# Patient Record
Sex: Male | Born: 1985 | Race: Black or African American | Marital: Single
Health system: Midwestern US, Community
[De-identification: ages and names within clinical notes are randomized; demographics above are authoritative.]

---

## 2013-01-04 LAB — METABOLIC PANEL, COMPREHENSIVE
A-G Ratio: 1.2 (ref 1.2–3.5)
ALT (SGPT): 30 U/L (ref 12–65)
AST (SGOT): 18 U/L (ref 15–37)
Albumin: 4.1 g/dL (ref 3.5–5.0)
Alk. phosphatase: 71 U/L (ref 50–136)
Anion gap: 3 mmol/L — ABNORMAL LOW (ref 7–16)
BUN: 9 MG/DL (ref 6–23)
Bilirubin, total: 0.2 MG/DL (ref 0.2–1.1)
CO2: 33 mmol/L — ABNORMAL HIGH (ref 21–32)
Calcium: 9.2 MG/DL (ref 8.3–10.4)
Chloride: 101 mmol/L (ref 98–107)
Creatinine: 1.4 MG/DL (ref 0.8–1.5)
GFR est AA: >60 mL/min/{1.73_m2} (ref >60)
GFR est non-AA: >60 mL/min/{1.73_m2} (ref >60)
Globulin: 3.4 g/dL (ref 2.3–3.5)
Glucose: 90 mg/dL (ref 65–100)
Potassium: 3.3 mmol/L — ABNORMAL LOW (ref 3.5–5.1)
Protein, total: 7.5 g/dL (ref 6.3–8.2)
Sodium: 137 mmol/L (ref 136–145)

## 2013-01-04 LAB — EKG, 12 LEAD, INITIAL
Atrial Rate: 67 {beats}/min
Calculated P Axis: 55 degrees
Calculated R Axis: 79 degrees
Calculated T Axis: 41 degrees
Diagnosis: NORMAL
P-R Interval: 172 ms
Q-T Interval: 408 ms
QRS Duration: 104 ms
QTC Calculation (Bezet): 431 ms
Ventricular Rate: 67 {beats}/min

## 2013-01-04 LAB — CBC WITH AUTOMATED DIFF
ABS. BASOPHILS: 0 10*3/uL (ref 0.0–0.2)
ABS. EOSINOPHILS: 0.2 10*3/uL (ref 0.0–0.8)
ABS. IMM. GRANS.: 0 10*3/uL (ref 0.0–0.5)
ABS. LYMPHOCYTES: 4.5 10*3/uL (ref 0.5–4.6)
ABS. MONOCYTES: 0.7 10*3/uL (ref 0.1–1.3)
ABS. NEUTROPHILS: 4.8 10*3/uL (ref 1.7–8.2)
BASOPHILS: 0 % (ref 0.0–2.0)
EOSINOPHILS: 2 % (ref 0.5–7.8)
HCT: 42.8 % (ref 41.1–50.3)
HGB: 15.1 g/dL (ref 13.6–17.2)
IMMATURE GRANULOCYTES: 0.3 % (ref 0.0–5.0)
LYMPHOCYTES: 44 % (ref 13–44)
MCH: 31.1 PG (ref 26.1–32.9)
MCHC: 35.3 g/dL — ABNORMAL HIGH (ref 31.4–35.0)
MCV: 88.1 FL (ref 79.6–97.8)
MONOCYTES: 7 % (ref 4.0–12.0)
MPV: 10.3 FL — ABNORMAL LOW (ref 10.8–14.1)
NEUTROPHILS: 47 % (ref 43–78)
PLATELET: 184 10*3/uL (ref 150–450)
RBC: 4.86 M/uL (ref 4.23–5.67)
RDW: 12.4 % (ref 11.9–14.6)
WBC: 10.1 10*3/uL (ref 4.3–11.1)

## 2013-01-04 LAB — DRUG SCREEN, URINE
AMPHETAMINES: POSITIVE
BARBITURATES: NEGATIVE
BENZODIAZEPINES: NEGATIVE
COCAINE: POSITIVE
METHADONE: NEGATIVE
OPIATES: NEGATIVE
PCP(PHENCYCLIDINE): NEGATIVE
THC (TH-CANNABINOL): NEGATIVE

## 2013-01-04 LAB — POC TROPONIN: Troponin-I (POC): 0.02 ng/ml (ref 0.0–0.08)

## 2013-01-04 MED ORDER — LORAZEPAM 2 MG/ML IJ SOLN
2 mg/mL | INTRAMUSCULAR | Status: AC
Start: 2013-01-04 — End: 2013-01-04
  Administered 2013-01-04: 08:00:00 via INTRAVENOUS

## 2013-01-04 MED FILL — LORAZEPAM 2 MG/ML IJ SOLN: 2 mg/mL | INTRAMUSCULAR | Qty: 1

## 2013-01-04 NOTE — ED Provider Notes (Signed)
Patient is a 27 y.o. male presenting with chest pain. The history is provided by the patient.   Chest Pain   This is a new problem. The current episode started yesterday. The problem has been resolved. Duration of episode(s) is 20 minutes. The problem occurs daily. The pain is associated with drug use. The pain is present in the substernal region. The pain is at a severity of 4/10. The pain is moderate. The quality of the pain is described as sharp. The pain does not radiate. Associated symptoms include shortness of breath. Pertinent negatives include no abdominal pain, no back pain, no diaphoresis, no exertional chest pressure, no irregular heartbeat, no malaise/fatigue, no nausea, no near-syncope, no numbness and no vomiting. He has tried nothing for the symptoms. Risk factors include male gender. His past medical history does not include DM or HTN.        History reviewed. No pertinent past medical history.     History reviewed. No pertinent past surgical history.      History reviewed. No pertinent family history.     History     Social History   ??? Marital Status: SINGLE     Spouse Name: N/A     Number of Children: N/A   ??? Years of Education: N/A     Occupational History   ??? Not on file.     Social History Main Topics   ??? Smoking status: Current Every Day Smoker   ??? Smokeless tobacco: Not on file   ??? Alcohol Use: Yes      Comment: occas   ??? Drug Use: Yes     Special: Cocaine      Comment: hx heroin and opiate abuse   ??? Sexually Active: Not on file     Other Topics Concern   ??? Not on file     Social History Narrative   ??? No narrative on file                  ALLERGIES: Review of patient's allergies indicates no known allergies.      Review of Systems   Constitutional: Negative for chills, malaise/fatigue and diaphoresis.   HENT: Negative for neck pain and neck stiffness.    Eyes: Negative for photophobia, redness and visual disturbance.   Respiratory: Positive for shortness of breath. Negative for choking.     Cardiovascular: Positive for chest pain. Negative for leg swelling and near-syncope.   Gastrointestinal: Negative for nausea, vomiting and abdominal pain.   Endocrine: Negative for polydipsia, polyphagia and polyuria.   Genitourinary: Negative for dysuria and flank pain.   Musculoskeletal: Negative for back pain and gait problem.   Skin: Negative for pallor.   Allergic/Immunologic: Negative for food allergies and immunocompromised state.   Neurological: Negative for syncope, speech difficulty, light-headedness and numbness.   Hematological: Negative for adenopathy. Does not bruise/bleed easily.   Psychiatric/Behavioral: Negative for behavioral problems and decreased concentration.   All other systems reviewed and are negative.        Filed Vitals:    01/04/13 0411   BP: 144/84   Pulse: 74   Temp: 96.9 ??F (36.1 ??C)   Resp: 22   Height: 1' (0.305 m)   SpO2: 98%            Physical Exam   Nursing note and vitals reviewed.  Constitutional: He is oriented to person, place, and time. He appears well-developed and well-nourished. No distress.   HENT:   Head: Normocephalic  and atraumatic.   Mouth/Throat: Oropharynx is clear and moist. No oropharyngeal exudate.   Eyes: Conjunctivae and EOM are normal. Pupils are equal, round, and reactive to light. No scleral icterus.   Neck: Normal range of motion. Neck supple. No JVD present. No tracheal deviation present. No thyromegaly present.   Cardiovascular: Normal rate, regular rhythm, normal heart sounds and intact distal pulses.  Exam reveals no gallop and no friction rub.    No murmur heard.  Pulmonary/Chest: Effort normal and breath sounds normal. No stridor. No respiratory distress. He has no wheezes. He has no rales. He exhibits no tenderness.   Abdominal: Soft. Bowel sounds are normal. He exhibits no distension and no mass. There is no tenderness. There is no rebound and no guarding.   Musculoskeletal: Normal range of motion. He exhibits no edema and no tenderness.    Lymphadenopathy:     He has no cervical adenopathy.   Neurological: He is alert and oriented to person, place, and time. He has normal reflexes. He displays normal reflexes. No cranial nerve deficit. He exhibits normal muscle tone. Coordination normal.   Skin: Skin is warm and dry. No rash noted. He is not diaphoretic. No erythema. No pallor.   Psychiatric: He has a normal mood and affect. His behavior is normal. Judgment and thought content normal.        MDM     Differential Diagnosis; Clinical Impression; Plan:     Chest pain associated with crack cocaine use yesterday.   I do not feel his symptoms are secondary to Acute Coronary Syndrome, Pulmonary Embolism, Aortic Dissection, Pneumothorax or Esophageal Rupture.        Amount and/or Complexity of Data Reviewed:   Clinical lab tests:  Ordered and reviewed  Tests in the radiology section of CPT??:  Ordered and reviewed   Independant visualization of image, tracing, or specimen:  Yes (EKG Interpretation: NSR, Rate of 72, No blocks, no ischemia, normal axis)  Risk of Significant Complications, Morbidity, and/or Mortality:   Presenting problems:  Moderate  Diagnostic procedures:  Moderate  Management options:  Moderate  Progress:   Patient progress:  Stable      Procedures

## 2013-01-04 NOTE — ED Notes (Signed)
I have reviewed discharge instructions with the patient and caregiver. Prescription(s) were not given.  All questions answered.  The patient verbalized understanding.  Discharge ambualtory in no distress with patient and caregiver.

## 2013-01-04 NOTE — ED Notes (Signed)
Pt states he has felt lightheaded but "it's probably me not eating right".

## 2013-01-04 NOTE — ED Notes (Signed)
Pt appears to be sleeping , eyes closed, rr even unlabored, vss on monitor.

## 2013-11-07 NOTE — ED Provider Notes (Addendum)
HPI Comments: Pt uses crack cocaine. Recently got back from [place] and partied a lot there. Having chest pain off and on since then lasting 15seconds. Worse today more frequent. Also some epigastric pain.     Patient is a 28 y.o. male presenting with chest pain. The history is provided by the patient. No language interpreter was used.   Chest Pain (Angina)   This is a new problem. The current episode started more than 1 week ago. The problem has been gradually worsening. Duration of episode(s) is 15 seconds. The problem occurs daily. The pain is associated with normal activity. The pain is present in the left side. The pain is moderate. The quality of the pain is described as sharp. The pain does not radiate. Associated symptoms include palpitations. Pertinent negatives include no abdominal pain, no back pain, no diaphoresis, no dizziness, no exertional chest pressure, no fever, no headaches, no irregular heartbeat, no leg pain, no malaise/fatigue, no nausea, no numbness, no shortness of breath, no vomiting and no weakness. He has tried nothing for the symptoms. Risk factors include smoking/tobacco exposure.        History reviewed. No pertinent past medical history.     History reviewed. No pertinent past surgical history.      History reviewed. No pertinent family history.     History     Social History   ??? Marital Status: SINGLE     Spouse Name: N/A     Number of Children: N/A   ??? Years of Education: N/A     Occupational History   ??? Not on file.     Social History Main Topics   ??? Smoking status: Current Every Day Smoker   ??? Smokeless tobacco: Not on file   ??? Alcohol Use: Yes      Comment: occas   ??? Drug Use: Yes     Special: Cocaine      Comment: hx heroin and opiate abuse   ??? Sexual Activity: Not on file     Other Topics Concern   ??? Not on file     Social History Narrative                  ALLERGIES: Review of patient's allergies indicates no known allergies.      Review of Systems   Constitutional: Negative  for fever, chills, malaise/fatigue and diaphoresis.   HENT: Negative for rhinorrhea and sore throat.    Eyes: Negative for pain and redness.   Respiratory: Negative for chest tightness, shortness of breath and wheezing.    Cardiovascular: Positive for chest pain and palpitations. Negative for leg swelling.   Gastrointestinal: Negative for nausea, vomiting, abdominal pain and diarrhea.   Genitourinary: Negative for dysuria and hematuria.   Musculoskeletal: Negative for back pain, gait problem, neck pain and neck stiffness.   Skin: Negative for color change and rash.   Neurological: Negative for dizziness, weakness, numbness and headaches.       Filed Vitals:    11/07/13 2339   BP: 150/86   Pulse: 93   Temp: 98.6 ??F (37 ??C)   Resp: 16   Height: 5' 3"  (1.6 m)   Weight: 56.7 kg (125 lb)   SpO2: 99%            Physical Exam   Constitutional: He is oriented to person, place, and time. He appears well-developed and well-nourished. No distress.   HENT:   Head: Normocephalic and atraumatic.   Neck: Normal range  of motion. Neck supple.   Cardiovascular: Normal rate and regular rhythm.    No murmur heard.  Pulmonary/Chest: Effort normal and breath sounds normal. He has no wheezes.   Abdominal: Soft. Bowel sounds are normal. There is no tenderness.   Musculoskeletal: Normal range of motion. He exhibits no edema.   Neurological: He is alert and oriented to person, place, and time.   Skin: Skin is warm and dry. He is not diaphoretic.   Nursing note and vitals reviewed.       MDM  Number of Diagnoses or Management Options     Amount and/or Complexity of Data Reviewed  Clinical lab tests: ordered and reviewed  Tests in the radiology section of CPT??: ordered and reviewed  Tests in the medicine section of CPT??: ordered and reviewed    Risk of Complications, Morbidity, and/or Mortality  Presenting problems: moderate  Diagnostic procedures: moderate  Management options: moderate    Patient Progress  Patient progress: stable       Procedures    EKG: normal sinus rhythm, nonspecific ST and T waves changes. Rate 90.       Final result (11/08/2013 1:09 AM) Open       Study Result    Portable Chest X-ray 11/08/2013 at 0022 hours  Indication: Chest pain  Comparison: January 04, 2013  Findings: Portable AP view demonstrates lungs normally expanded and clear.  Marginated costophrenic angles. Negative for pneumothorax. Heart size normal.  Impression: Negative portable chest x-ray     Results Include:    Recent Results (from the past 24 hour(s))   CBC WITH AUTOMATED DIFF    Collection Time     11/07/13 11:45 PM       Result Value Ref Range    WBC 10.0  4.3 - 11.1 K/uL    RBC 5.14  4.23 - 5.67 M/uL    HGB 15.8  13.6 - 17.2 g/dL    HCT 44.9  41.1 - 50.3 %    MCV 87.4  79.6 - 97.8 FL    MCH 30.7  26.1 - 32.9 PG    MCHC 35.2 (*) 31.4 - 35.0 g/dL    RDW 12.3  11.9 - 14.6 %    PLATELET 204  150 - 450 K/uL    MPV 10.2 (*) 10.8 - 14.1 FL    DF AUTOMATED      NEUTROPHILS 46  43 - 78 %    LYMPHOCYTES 44  13 - 44 %    MONOCYTES 7  4.0 - 12.0 %    EOSINOPHILS 2  0.5 - 7.8 %    BASOPHILS 1  0.0 - 2.0 %    IMMATURE GRANULOCYTES 0.2  0.0 - 5.0 %    ABS. NEUTROPHILS 4.7  1.7 - 8.2 K/UL    ABS. LYMPHOCYTES 4.4  0.5 - 4.6 K/UL    ABS. MONOCYTES 0.7  0.1 - 1.3 K/UL    ABS. EOSINOPHILS 0.2  0.0 - 0.8 K/UL    ABS. BASOPHILS 0.1  0.0 - 0.2 K/UL    ABS. IMM. GRANS. 0.0  0.0 - 0.5 K/UL   METABOLIC PANEL, COMPREHENSIVE    Collection Time     11/07/13 11:45 PM       Result Value Ref Range    Sodium 140  136 - 145 mmol/L    Potassium 3.3 (*) 3.5 - 5.1 mmol/L    Chloride 101  98 - 107 mmol/L    CO2 32  21 -  32 mmol/L    Anion gap 7  7 - 16 mmol/L    Glucose 94  65 - 100 mg/dL    BUN 10  6 - 23 MG/DL    Creatinine 1.40  0.8 - 1.5 MG/DL    GFR est AA >60  >60 ml/min/1.17m    GFR est non-AA >60  >60 ml/min/1.728m   Calcium 8.5  8.3 - 10.4 MG/DL    Bilirubin, total 0.3  0.2 - 1.1 MG/DL    ALT 37  12 - 65 U/L    AST 16  15 - 37 U/L    Alk. phosphatase 62  50 - 136 U/L    Protein, total  7.2  6.3 - 8.2 g/dL    Albumin 3.8  3.5 - 5.0 g/dL    Globulin 3.4  2.3 - 3.5 g/dL    A-G Ratio 1.1 (*) 1.2 - 3.5     POC TROPONIN-I    Collection Time     11/07/13 11:53 PM       Result Value Ref Range    Troponin-I (POC) 0.01  0.0 - 0.08 ng/ml   DRUG SCREEN, URINE    Collection Time     11/08/13 12:20 AM       Result Value Ref Range    PCP(PHENCYCLIDINE) NEGATIVE       BENZODIAZEPINE NEGATIVE       COCAINE POSITIVE      AMPHETAMINE NEGATIVE       METHADONE NEGATIVE       THC (TH-CANNABINOL) NEGATIVE       OPIATES NEGATIVE       BARBITURATES NEGATIVE          Pt pain free now. With last use 2 days ago and no chest pain now and negative trop feel ok to discharge pt.

## 2013-11-08 LAB — METABOLIC PANEL, COMPREHENSIVE
A-G Ratio: 1.1 — ABNORMAL LOW (ref 1.2–3.5)
ALT (SGPT): 37 U/L (ref 12–65)
AST (SGOT): 16 U/L (ref 15–37)
Albumin: 3.8 g/dL (ref 3.5–5.0)
Alk. phosphatase: 62 U/L (ref 50–136)
Anion gap: 7 mmol/L (ref 7–16)
BUN: 10 MG/DL (ref 6–23)
Bilirubin, total: 0.3 MG/DL (ref 0.2–1.1)
CO2: 32 mmol/L (ref 21–32)
Calcium: 8.5 MG/DL (ref 8.3–10.4)
Chloride: 101 mmol/L (ref 98–107)
Creatinine: 1.4 MG/DL (ref 0.8–1.5)
GFR est AA: >60 mL/min/{1.73_m2} (ref >60)
GFR est non-AA: >60 mL/min/{1.73_m2} (ref >60)
Globulin: 3.4 g/dL (ref 2.3–3.5)
Glucose: 94 mg/dL (ref 65–100)
Potassium: 3.3 mmol/L — ABNORMAL LOW (ref 3.5–5.1)
Protein, total: 7.2 g/dL (ref 6.3–8.2)
Sodium: 140 mmol/L (ref 136–145)

## 2013-11-08 LAB — CBC WITH AUTOMATED DIFF
ABS. BASOPHILS: 0.1 10*3/uL (ref 0.0–0.2)
ABS. EOSINOPHILS: 0.2 10*3/uL (ref 0.0–0.8)
ABS. IMM. GRANS.: 0 10*3/uL (ref 0.0–0.5)
ABS. LYMPHOCYTES: 4.4 10*3/uL (ref 0.5–4.6)
ABS. MONOCYTES: 0.7 10*3/uL (ref 0.1–1.3)
ABS. NEUTROPHILS: 4.7 10*3/uL (ref 1.7–8.2)
BASOPHILS: 1 % (ref 0.0–2.0)
EOSINOPHILS: 2 % (ref 0.5–7.8)
HCT: 44.9 % (ref 41.1–50.3)
HGB: 15.8 g/dL (ref 13.6–17.2)
IMMATURE GRANULOCYTES: 0.2 % (ref 0.0–5.0)
LYMPHOCYTES: 44 % (ref 13–44)
MCH: 30.7 PG (ref 26.1–32.9)
MCHC: 35.2 g/dL — ABNORMAL HIGH (ref 31.4–35.0)
MCV: 87.4 FL (ref 79.6–97.8)
MONOCYTES: 7 % (ref 4.0–12.0)
MPV: 10.2 FL — ABNORMAL LOW (ref 10.8–14.1)
NEUTROPHILS: 46 % (ref 43–78)
PLATELET: 204 10*3/uL (ref 150–450)
RBC: 5.14 M/uL (ref 4.23–5.67)
RDW: 12.3 % (ref 11.9–14.6)
WBC: 10 10*3/uL (ref 4.3–11.1)

## 2013-11-08 LAB — EKG, 12 LEAD, INITIAL
Atrial Rate: 90 {beats}/min
Calculated P Axis: 65 degrees
Calculated R Axis: 65 degrees
Calculated T Axis: 43 degrees
P-R Interval: 168 ms
Q-T Interval: 352 ms
QRS Duration: 94 ms
QTC Calculation (Bezet): 430 ms
Ventricular Rate: 90 {beats}/min

## 2013-11-08 LAB — DRUG SCREEN, URINE
AMPHETAMINES: NEGATIVE
BARBITURATES: NEGATIVE
BENZODIAZEPINES: NEGATIVE
COCAINE: POSITIVE
METHADONE: NEGATIVE
OPIATES: NEGATIVE
PCP(PHENCYCLIDINE): NEGATIVE
THC (TH-CANNABINOL): NEGATIVE

## 2013-11-08 LAB — POC TROPONIN: Troponin-I (POC): 0.01 ng/ml (ref 0.0–0.08)

## 2013-11-08 MED ORDER — ASPIRIN 325 MG TAB
325 mg | ORAL | Status: AC
Start: 2013-11-08 — End: 2013-11-08
  Administered 2013-11-08: 04:00:00 via ORAL

## 2013-11-08 MED ORDER — LORAZEPAM 1 MG TAB
1 mg | ORAL | Status: AC
Start: 2013-11-08 — End: 2013-11-08
  Administered 2013-11-08: 04:00:00 via ORAL

## 2013-11-08 MED ORDER — SODIUM CHLORIDE 0.9% BOLUS IV
0.9 % | Freq: Once | INTRAVENOUS | Status: AC
Start: 2013-11-08 — End: 2013-11-08
  Administered 2013-11-08: 04:00:00 via INTRAVENOUS

## 2013-11-08 MED ORDER — ONDANSETRON (PF) 4 MG/2 ML INJECTION
4 mg/2 mL | INTRAMUSCULAR | Status: AC
Start: 2013-11-08 — End: 2013-11-08
  Administered 2013-11-08: 04:00:00 via INTRAVENOUS

## 2013-11-08 MED FILL — ASPIRIN 325 MG TAB: 325 mg | ORAL | Qty: 1

## 2013-11-08 MED FILL — ONDANSETRON (PF) 4 MG/2 ML INJECTION: 4 mg/2 mL | INTRAMUSCULAR | Qty: 2

## 2013-11-08 MED FILL — LORAZEPAM 1 MG TAB: 1 mg | ORAL | Qty: 1

## 2013-11-08 NOTE — ED Notes (Signed)
I have reviewed discharge instructions with the patient.  The patient verbalized understanding.pt ambulated out in nad

## 2018-05-24 ENCOUNTER — Other Ambulatory Visit: Payer: Self-pay

## 2018-05-24 ENCOUNTER — Emergency Department
Admission: EM | Admit: 2018-05-24 | Discharge: 2018-05-24 | Disposition: A | Discharge status: Home / Self Care | Payer: Self-pay | Attending: Emergency Medicine | Admitting: Emergency Medicine

## 2018-05-24 ENCOUNTER — Encounter: Payer: Self-pay | Admitting: Emergency Medicine

## 2018-05-24 ENCOUNTER — Emergency Department: Payer: Self-pay

## 2018-05-24 DIAGNOSIS — M79642 Pain in left hand: Secondary | ICD-10-CM | POA: Insufficient documentation

## 2018-05-24 DIAGNOSIS — R200 Anesthesia of skin: Secondary | ICD-10-CM | POA: Insufficient documentation

## 2018-05-24 DIAGNOSIS — F17200 Nicotine dependence, unspecified, uncomplicated: Secondary | ICD-10-CM | POA: Insufficient documentation

## 2018-05-24 LAB — GLUCOSE, CAPILLARY: GLUCOSE-CAPILLARY: 140 mg/dL — AB (ref 70–99)

## 2018-05-24 MED ORDER — PREDNISONE 10 MG PO TABS: ORAL_TABLET | ORAL | 0 refills | Status: AC

## 2018-05-24 NOTE — ED Provider Notes (Signed)
[facility] Emergency Department Provider Note   ____________________________________________   First MD Initiated Contact with Patient 05/24/18 1933     (approximate)  I have reviewed the triage vital signs and the nursing notes.   HISTORY  Chief Complaint Hand Pain   HPI Phillip Vega is a 32 y.o. male presents to the ED with complaint of swelling, pain and numbness to bilateral hands for the last 2 days.  Patient denies any injury to his hands.  He has not taken any over-the-counter medication.  He states that it is difficult for him to make a complete fist due to swelling and discomfort.  He rates his discomfort as an 8/10.  History reviewed. No pertinent past medical history.  There are no active problems to display for this patient.   History reviewed. No pertinent surgical history.  Prior to Admission medications   Medication Sig Start Date End Date Taking? Authorizing Provider  predniSONE (DELTASONE) 10 MG tablet Take 6 tablets  today, on day 2 take 5 tablets, day 3 take 4 tablets, day 4 take 3 tablets, day 5 take  2 tablets and 1 tablet the last day 05/24/18   Tommi Rumps, PA-C    Allergies Patient has no allergy information on record.  No family history on file.  Social History Social History   Tobacco Use  . Smoking status: Current Every Day Smoker  . Smokeless tobacco: Never Used  Substance Use Topics  . Alcohol use: Yes  . Drug use: Yes    Types: Marijuana, Cocaine    Review of Systems Constitutional: No fever/chills Cardiovascular: Denies chest pain. Respiratory: Denies shortness of breath. Musculoskeletal: Positive bilateral hand pain and swelling. Skin: Negative for rash. Neurological: Negative for headaches, focal weakness or numbness. ____________________________________________   PHYSICAL EXAM:  VITAL SIGNS: ED Triage Vitals [05/24/18 1916]  Enc Vitals Group     BP 127/79     Pulse Rate 78     Resp 16    Temp 98.5 F (36.9 C)     Temp Source Oral     SpO2 100 %     Weight 130 lb (59 kg)     Height 5\' 3"  (1.6 m)     Head Circumference      Peak Flow      Pain Score 8     Pain Loc      Pain Edu?      Excl. in GC?    Constitutional: Alert and oriented. Well appearing and in no acute distress. Eyes: Conjunctivae are normal.  Head: Atraumatic. Neck: No stridor.   Cardiovascular: Normal rate, regular rhythm. Grossly normal heart sounds.  Good peripheral circulation.  Capillary refill is less than 3 seconds. Respiratory: Normal respiratory effort.  No retractions. Lungs CTAB. Musculoskeletal: On examination of the hands bilaterally there is no gross deformity and no muscle wasting is noticed in comparison.  Patient is able to move all digits without any difficulty.  Grip is slightly decreased secondary to inability to make a complete fist.  Patient states that his fingers feel swollen making him feel as if he cannot completely close his fist.  Good muscle strength in all digits.  No weakness noted with range of motion of the wrists bilaterally.  Skin is intact and no discoloration.  Tinel sign is negative. Neurologic:  Normal speech and language. No gross focal neurologic deficits are appreciated.  Skin:  Skin is warm, dry and intact. No rash noted. Psychiatric: Mood  and affect are normal. Speech and behavior are normal.  ____________________________________________   LABS (all labs ordered are listed, but only abnormal results are displayed)  Labs Reviewed  GLUCOSE, CAPILLARY - Abnormal; Notable for the following components:      Result Value   Glucose-Capillary 140 (*)    All other components within normal limits    RADIOLOGY  ED MD interpretation:  X-ray of bilateral hands is negative for acute bony injuries.  Official radiology report(s): Dg Hand Complete Left  Result Date: 05/24/2018 CLINICAL DATA:  Pain and swelling for 2 days, no known injury, initial encounter EXAM: LEFT  HAND - COMPLETE 3+ VIEW COMPARISON:  None. FINDINGS: There is no evidence of fracture or dislocation. There is no evidence of arthropathy or other focal bone abnormality. Soft tissues are unremarkable. IMPRESSION: No acute abnormality noted. Electronically Signed   By: Alcide Clever M.D.   On: 05/24/2018 19:51   Dg Hand Complete Right  Result Date: 05/24/2018 CLINICAL DATA:  Pain and swelling in both hands, no known injury, initial encounter EXAM: RIGHT HAND - COMPLETE 3+ VIEW COMPARISON:  None. FINDINGS: There is no evidence of fracture or dislocation. There is no evidence of arthropathy or other focal bone abnormality. Soft tissues are unremarkable. IMPRESSION: No acute abnormality noted. Electronically Signed   By: Alcide Clever M.D.   On: 05/24/2018 19:52    ____________________________________________   PROCEDURES  Procedure(s) performed: None  Procedures  Critical Care performed: No  ____________________________________________   INITIAL IMPRESSION / ASSESSMENT AND PLAN / ED COURSE  As part of my medical decision making, I reviewed the following data within the electronic MEDICAL RECORD NUMBER Notes from prior ED visits and [place] Controlled Substance Database  Patient presents to the ED with complaint of numbness to both his hands for the last 2 days.  He denies any injury.  There is been no prior problems similar to this.  Patient does work with his hands.  Glucose level was elevated however patient drank a soda and had food just prior to walking into triage.  Physical exam of his hands was negative.  Motor or sensory function intact and capillary refill is less than 3 seconds.  Tennille sign was negative but does not completely rule out carpal tunnel syndrome.  Patient was started on prednisone 6-day taper.  He is to follow-up with [facility] orthopedic department if any continued problems for further work-up.  ____________________________________________   FINAL CLINICAL  IMPRESSION(S) / ED DIAGNOSES  Final diagnoses:  Bilateral hand numbness     ED Discharge Orders         Ordered    predniSONE (DELTASONE) 10 MG tablet     05/24/18 2032           Note:  This document was prepared using Dragon voice recognition software and may include unintentional dictation errors.    Tommi Rumps, PA-C 05/24/18 2133    Sharyn Creamer, MD 05/25/18 2042

## 2018-05-24 NOTE — ED Notes (Signed)
FIRST NURSE NOTE:  Pt reports numbness to hands and fingers x 2 days.

## 2018-05-24 NOTE — Discharge Instructions (Signed)
Follow-up with Dr. Rosita Kea who is on-call for orthopedics if any continued problems with your hands.  Begin taking prednisone as directed.  As of tonight the cheapest place is [facility] with a good Rx card.  It can also be obtained at [facility] for less if you have a membership to their pharmacy program.  Take this medication as prescribed.  If there is no improvement he will need more testing such as a nerve conduction study.

## 2018-05-24 NOTE — ED Triage Notes (Signed)
Pt arrived with concerns over swelling and pain to both hands bilaterally. Pt denies injury. Pulses present and strong. No obvious deformity.

## 2018-05-24 NOTE — ED Notes (Signed)
Pt reports for the last 2 days without injury or known cause , he has had and still has numbness to fingers both left and right hands .  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

## 2019-09-04 IMAGING — DX DG HAND COMPLETE 3+V*L*
3 series · 3 of 3 positions shown · non-contrast
Comparison: None.

CLINICAL DATA: Pain and swelling for 2 days, no known injury,
initial encounter

EXAM:
LEFT HAND - COMPLETE 3+ VIEW

[hand ap]
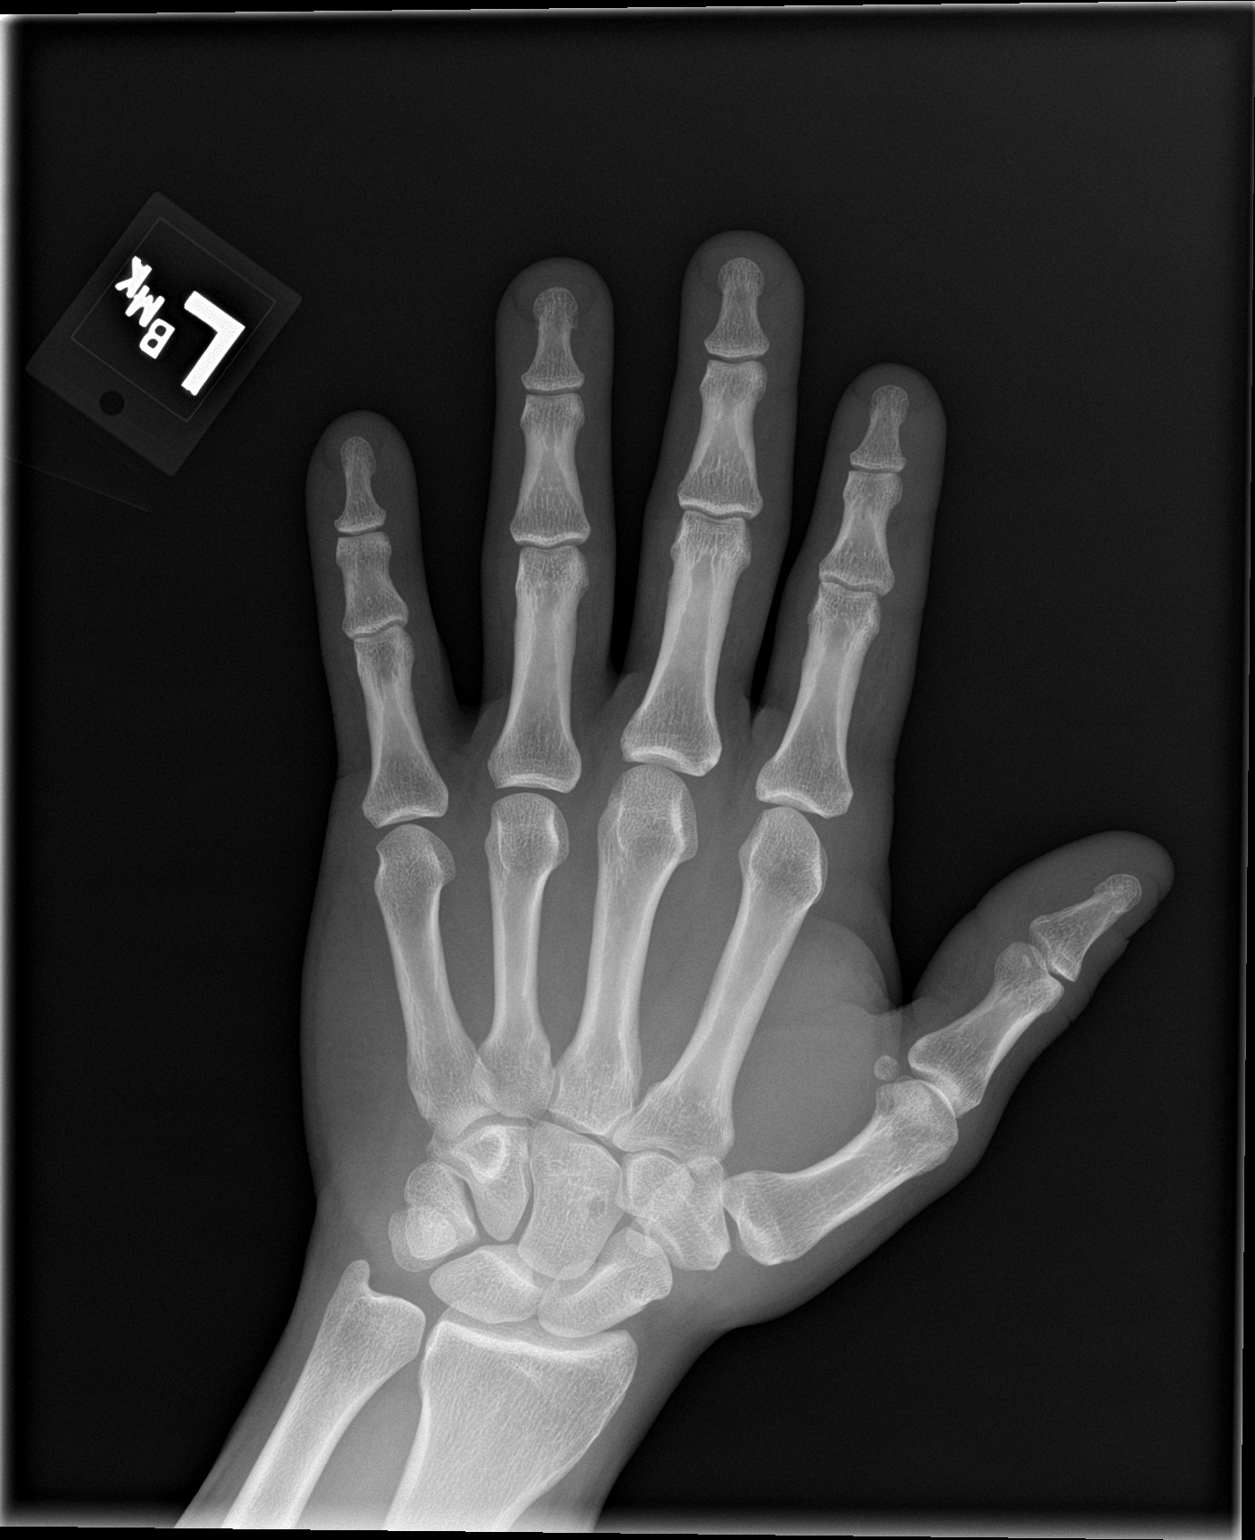

[hand obl]
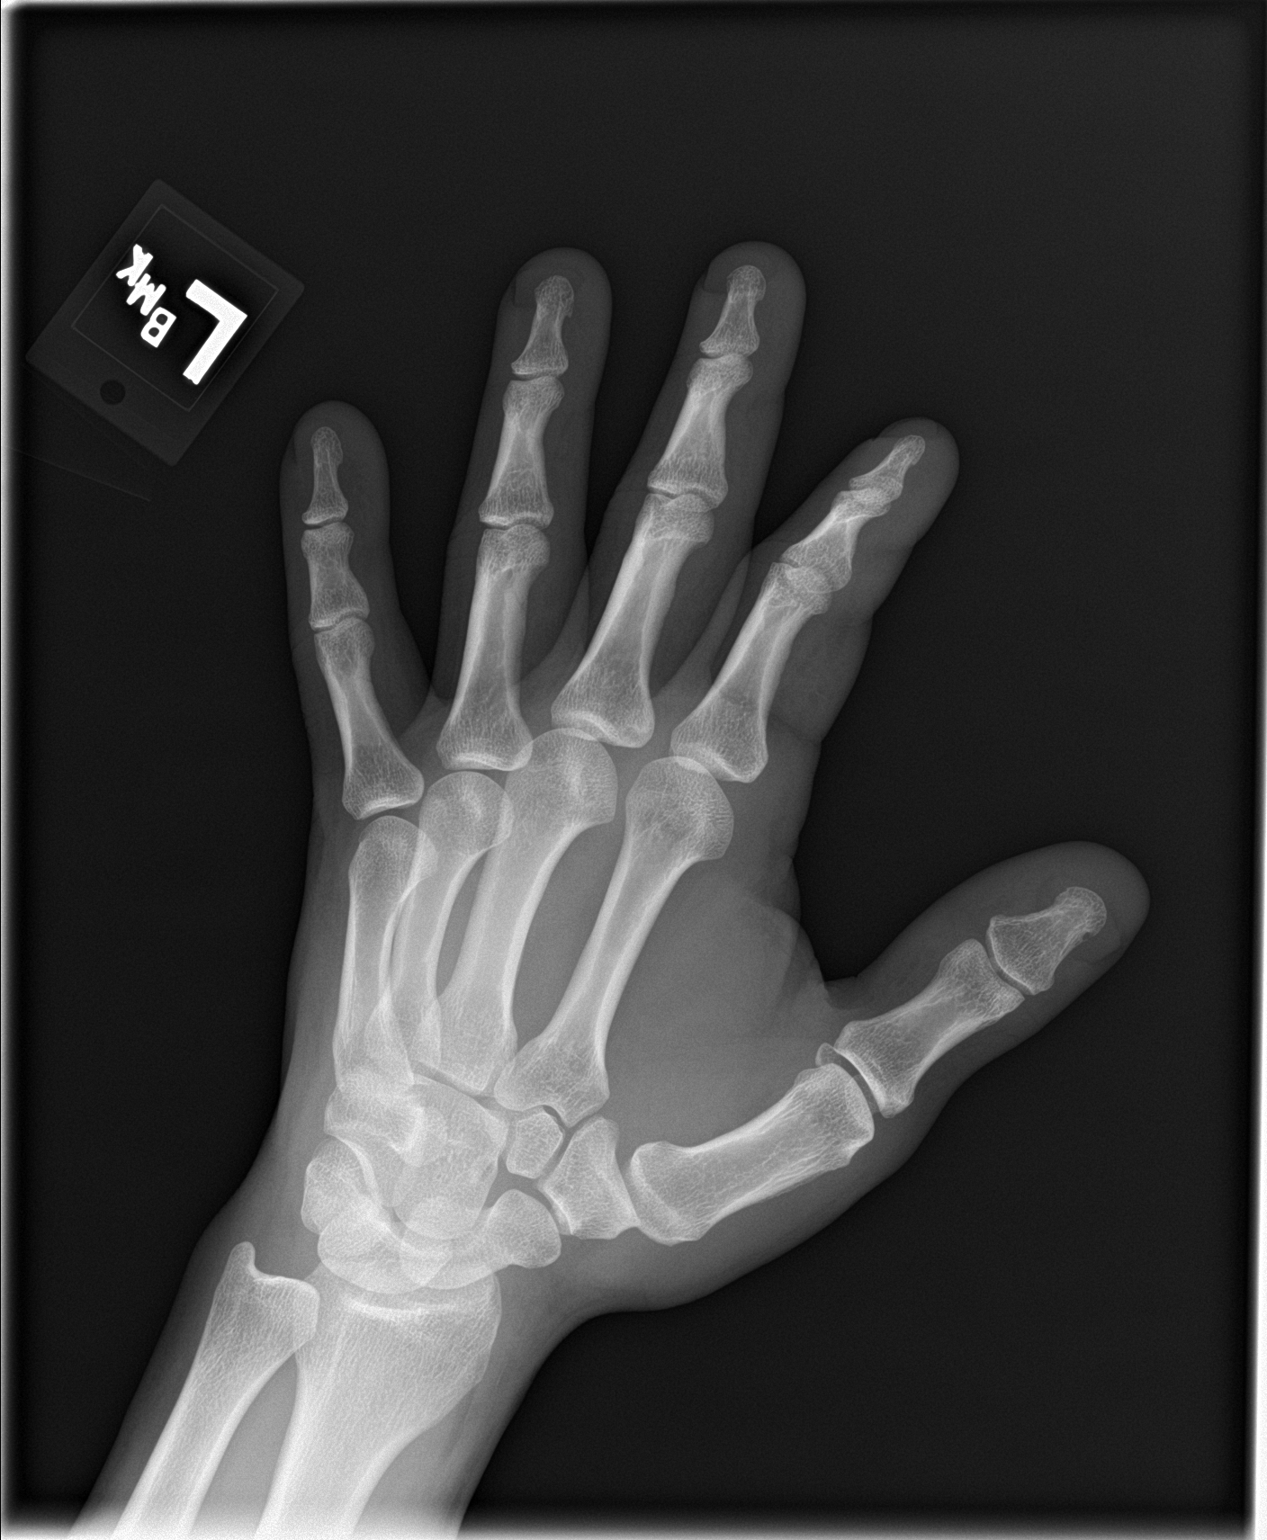

[hand lat]
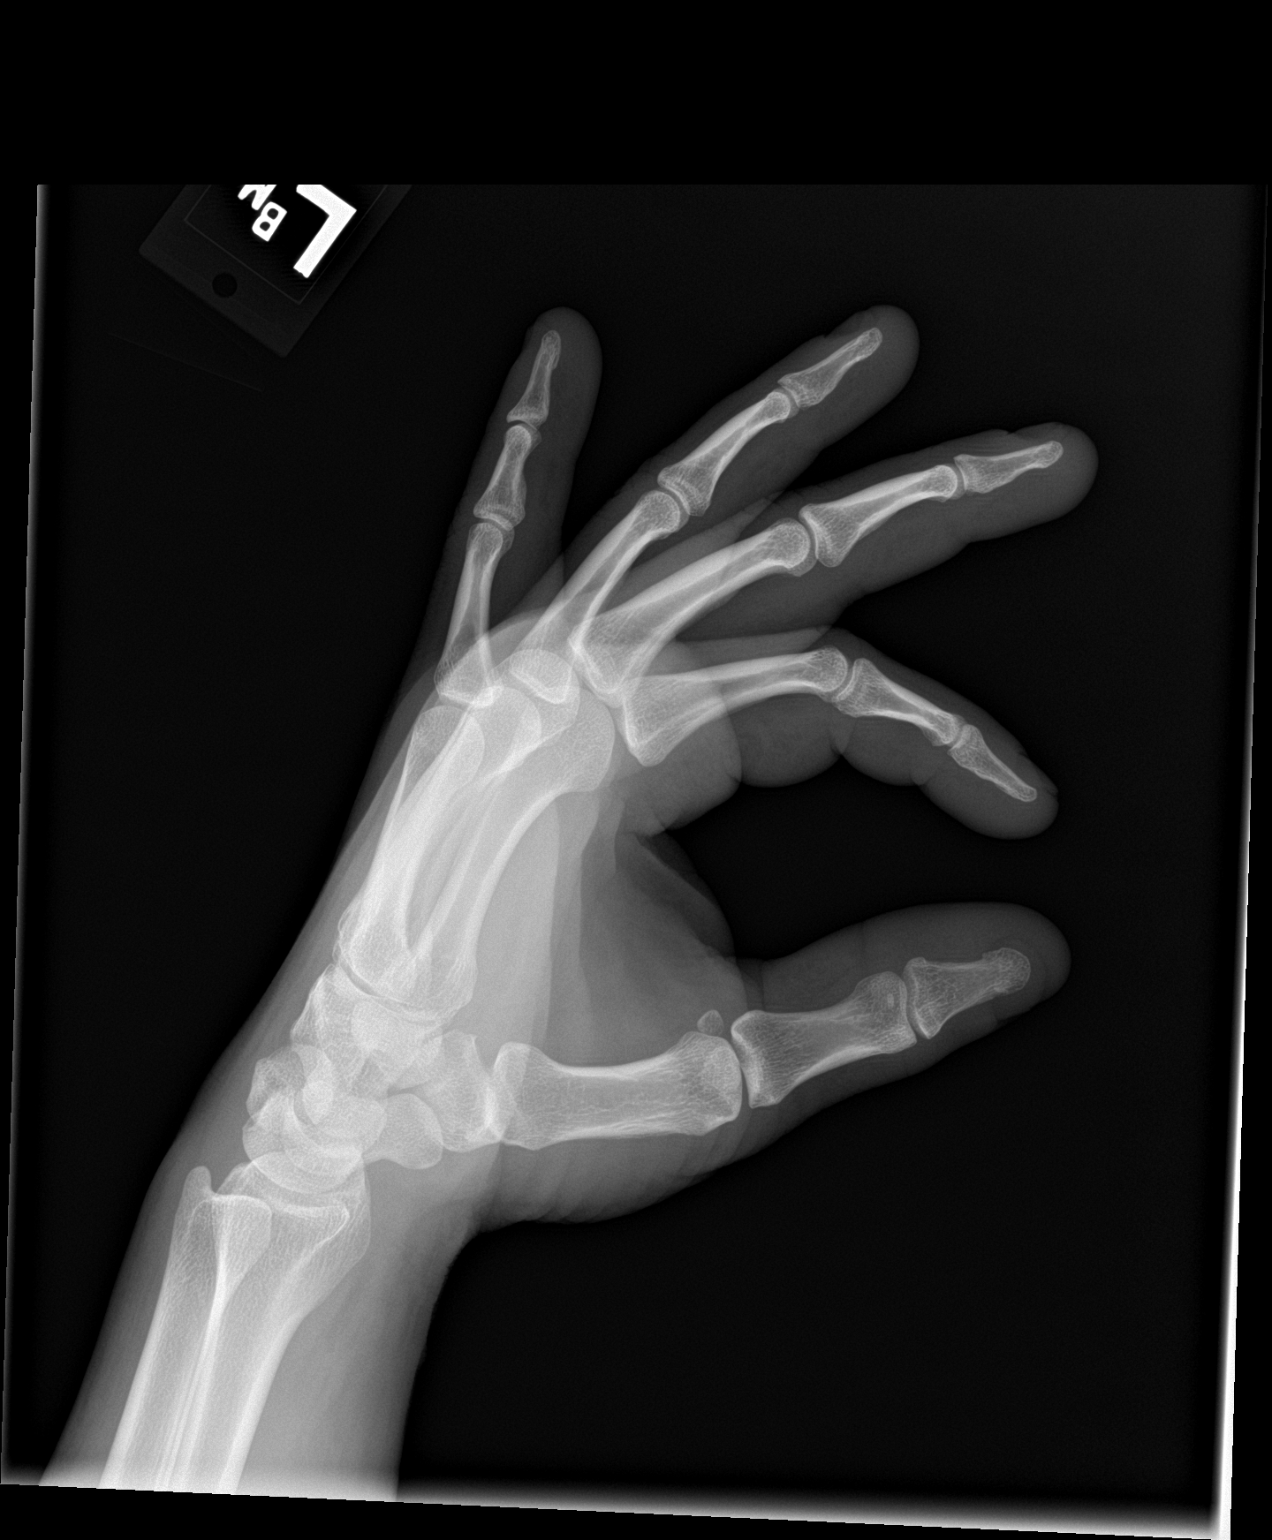

[3 of 3 positions shown; findings below may reference images not displayed]

FINDINGS: There is no evidence of fracture or dislocation. There is no
evidence of arthropathy or other focal bone abnormality. Soft
tissues are unremarkable.
IMPRESSION: No acute abnormality noted.
